# Patient Record
Sex: Female | Born: 1960
Health system: Southern US, Community
[De-identification: ages and names within clinical notes are randomized; demographics above are authoritative.]

## PROBLEM LIST (undated history)

## (undated) DIAGNOSIS — Z973 Presence of spectacles and contact lenses: Secondary | ICD-10-CM

## (undated) DIAGNOSIS — N9489 Other specified conditions associated with female genital organs and menstrual cycle: Secondary | ICD-10-CM

## (undated) DIAGNOSIS — Z8719 Personal history of other diseases of the digestive system: Secondary | ICD-10-CM

## (undated) DIAGNOSIS — E785 Hyperlipidemia, unspecified: Secondary | ICD-10-CM

## (undated) HISTORY — PX: OTHER SURGICAL HISTORY: SHX169

---
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
## 2013-02-28 ENCOUNTER — Other Ambulatory Visit: Payer: Self-pay | Admitting: Physician Assistant

## 2013-02-28 DIAGNOSIS — N6310 Unspecified lump in the right breast, unspecified quadrant: Secondary | ICD-10-CM

## 2013-12-08 ENCOUNTER — Emergency Department (HOSPITAL_COMMUNITY): Payer: BC Managed Care – PPO

## 2013-12-08 ENCOUNTER — Emergency Department (HOSPITAL_COMMUNITY)
Admission: EM | Admit: 2013-12-08 | Discharge: 2013-12-08 | Disposition: A | Discharge status: Home / Self Care | Payer: BC Managed Care – PPO | Attending: Emergency Medicine | Admitting: Emergency Medicine

## 2013-12-08 ENCOUNTER — Encounter (HOSPITAL_COMMUNITY): Payer: Self-pay | Admitting: Emergency Medicine

## 2013-12-08 DIAGNOSIS — S4980XA Other specified injuries of shoulder and upper arm, unspecified arm, initial encounter: Secondary | ICD-10-CM | POA: Insufficient documentation

## 2013-12-08 DIAGNOSIS — E785 Hyperlipidemia, unspecified: Secondary | ICD-10-CM | POA: Insufficient documentation

## 2013-12-08 DIAGNOSIS — Y92410 Unspecified street and highway as the place of occurrence of the external cause: Secondary | ICD-10-CM | POA: Diagnosis not present

## 2013-12-08 DIAGNOSIS — S1093XA Contusion of unspecified part of neck, initial encounter: Secondary | ICD-10-CM | POA: Diagnosis not present

## 2013-12-08 DIAGNOSIS — S0993XA Unspecified injury of face, initial encounter: Secondary | ICD-10-CM | POA: Diagnosis not present

## 2013-12-08 DIAGNOSIS — Y9389 Activity, other specified: Secondary | ICD-10-CM | POA: Diagnosis not present

## 2013-12-08 DIAGNOSIS — S0003XA Contusion of scalp, initial encounter: Secondary | ICD-10-CM | POA: Insufficient documentation

## 2013-12-08 DIAGNOSIS — S0083XA Contusion of other part of head, initial encounter: Secondary | ICD-10-CM | POA: Insufficient documentation

## 2013-12-08 DIAGNOSIS — S199XXA Unspecified injury of neck, initial encounter: Secondary | ICD-10-CM

## 2013-12-08 DIAGNOSIS — Z79899 Other long term (current) drug therapy: Secondary | ICD-10-CM | POA: Diagnosis not present

## 2013-12-08 DIAGNOSIS — S46909A Unspecified injury of unspecified muscle, fascia and tendon at shoulder and upper arm level, unspecified arm, initial encounter: Secondary | ICD-10-CM | POA: Diagnosis present

## 2013-12-08 HISTORY — DX: Hyperlipidemia, unspecified: E78.5

## 2013-12-08 LAB — I-STAT CHEM 8, ED
BUN: 6 mg/dL (ref 6–23)
CREATININE: 0.6 mg/dL (ref 0.50–1.10)
Calcium, Ion: 1.11 mmol/L — ABNORMAL LOW (ref 1.12–1.23)
Chloride: 107 mEq/L (ref 96–112)
Glucose, Bld: 114 mg/dL — ABNORMAL HIGH (ref 70–99)
HCT: 40 % (ref 36.0–46.0)
HEMOGLOBIN: 13.6 g/dL (ref 12.0–15.0)
Potassium: 3.8 mEq/L (ref 3.7–5.3)
Sodium: 139 mEq/L (ref 137–147)
TCO2: 24 mmol/L (ref 0–100)

## 2013-12-08 MED ORDER — IOHEXOL 300 MG/ML  SOLN
80.0000 mL | Freq: Once | INTRAMUSCULAR | Status: AC | PRN
Start: 2013-12-08 — End: 2013-12-08
  Administered 2013-12-08: 80 mL via INTRAVENOUS

## 2013-12-08 MED ORDER — HYDROGEN PEROXIDE 3 % EX SOLN
CUTANEOUS | Status: AC
  Administered 2013-12-08: 13:00:00
  Filled 2013-12-08: qty 473

## 2013-12-08 MED ORDER — MORPHINE SULFATE 4 MG/ML IJ SOLN
4.0000 mg | Freq: Once | INTRAMUSCULAR | Status: AC
  Administered 2013-12-08: 4 mg via INTRAVENOUS
  Filled 2013-12-08: qty 1
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
## 2013-12-08 NOTE — ED Notes (Signed)
NP at bedside.

## 2013-12-08 NOTE — ED Notes (Signed)
Pt decided to take remaining morphine.  Morphine given.
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
## 2013-12-08 NOTE — ED Notes (Signed)
Pt had a purse with her at discharge.
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
## 2013-12-08 NOTE — ED Notes (Signed)
Pt only wanted 2 mg of Morphine
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
## 2013-12-08 NOTE — ED Notes (Signed)
On assessment, pt denies any numbness/tingling to any extremity.  No chest pain.  No abdominal pain.  Does have pain to left side of head.  No shortness of breath.  Vitals stable.
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
## 2013-12-12 NOTE — ED Provider Notes (Deleted)
Medical screening examination/treatment/procedure(s) were performed by non-physician practitioner and as supervising physician I was immediately available for consultation/collaboration.  Truly Stankiewicz L Britt Petroni, MD 12/12/13 1107 
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
## 2016-09-25 ENCOUNTER — Encounter (HOSPITAL_BASED_OUTPATIENT_CLINIC_OR_DEPARTMENT_OTHER): Payer: Self-pay | Admitting: *Deleted

## 2016-12-08 ENCOUNTER — Encounter (HOSPITAL_BASED_OUTPATIENT_CLINIC_OR_DEPARTMENT_OTHER): Payer: Self-pay | Admitting: *Deleted

## 2016-12-08 NOTE — Progress Notes (Signed)
Pt instructed npo pmn 9/20.  To [facility] 9/21 @ 0600.  Pt to get cbc, cmet, T&S, on Wed 9/19.  Pt aware that she will need to wear bracelet, w/o removal, for t&s until p surgery , otherwise T&S will have to be redrawn.  . Pt verbalized her understanding.

## 2016-12-09 NOTE — H&P (Signed)
Stephanie Pittman is an 56 y.o. female with pelvic pain found to have right adnexal findings including 2 simple cysts measuring 2.3 and 3.2 cm as well as a tubular structure measuring 4.4 cm and likely hydrosalpinx.  CA-125 4.  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  She also is s/p endometrial ablation and left inguinal hernia repair.  The patient presents for surgical treatment of right adnexal mass and removal of remainder of left ovary.  Pertinent Gynecological History: Menses: post-menopausal Bleeding: n/a Contraception: post menopausal status DES exposure: unknown Blood transfusions: none Sexually transmitted diseases: no past history Previous GYN Procedures: endometrial ablation, laparotomy for right oophorectomy and partial left oophorectomy, left inguinal hernia repair  Last mammogram: normal Date: 07/2016 Last pap: normal Date: 07/2016 OB History: G4, P4  Menstrual History: Menarche age: n/a No LMP recorded. Patient is postmenopausal.    Past Medical History:  Diagnosis Date  . Adnexal mass    right  . History of diverticulitis   . Hyperlipidemia   . Wears glasses     Past Surgical History:  Procedure Laterality Date  . HERNIA REPAIR Left 1976  . ooophorectomy Right   . TONSILLECTOMY  1968    History reviewed. No pertinent family history.  Social History:  reports that she has never smoked. She has never used smokeless tobacco. She reports that she drinks alcohol. She reports that she does not use drugs.  Allergies: No Known Allergies  No prescriptions prior to admission.    ROS  Height  (1.626 m), weight 137 lb (62.1 kg). Physical Exam  Constitutional: She is oriented to person, place, and time. She appears well-developed and well-nourished.  GI: Soft. There is no rebound and no guarding.  Neurological: She is alert and oriented to person, place, and time.   Skin: Skin is warm and dry.  Psychiatric: She has a normal mood and affect. Her behavior is normal.    No results found for this or any previous visit (from the past 24 hour(s)).  No results found.  Assessment/Plan: 55yo with right adnexal mass -L/S removal of right adnexal mass and LSO -Patient has been counseled re: risk of bleeding, infection, scarring, and damage to surrounding structures.  She understands the possible need to convert to abdominal procedure to complete the case if needed.  All questions were answered and the patient wishes to proceed.  Khaleed Holan 12/09/2016, 1:39 PM

## 2016-12-10 DIAGNOSIS — N83201 Unspecified ovarian cyst, right side: Secondary | ICD-10-CM | POA: Diagnosis not present

## 2016-12-10 DIAGNOSIS — E785 Hyperlipidemia, unspecified: Secondary | ICD-10-CM | POA: Diagnosis not present

## 2016-12-10 DIAGNOSIS — N7011 Chronic salpingitis: Secondary | ICD-10-CM | POA: Diagnosis not present

## 2016-12-10 DIAGNOSIS — Z8719 Personal history of other diseases of the digestive system: Secondary | ICD-10-CM | POA: Diagnosis not present

## 2016-12-10 DIAGNOSIS — N949 Unspecified condition associated with female genital organs and menstrual cycle: Secondary | ICD-10-CM | POA: Diagnosis present

## 2016-12-10 LAB — CBC
HEMATOCRIT: 40.2 % (ref 36.0–46.0)
Hemoglobin: 14.1 g/dL (ref 12.0–15.0)
MCH: 31.4 pg (ref 26.0–34.0)
MCHC: 35.1 g/dL (ref 30.0–36.0)
MCV: 89.5 fL (ref 78.0–100.0)
Platelets: 290 10*3/uL (ref 150–400)
RBC: 4.49 MIL/uL (ref 3.87–5.11)
RDW: 12.8 % (ref 11.5–15.5)
WBC: 7 10*3/uL (ref 4.0–10.5)

## 2016-12-10 LAB — TYPE AND SCREEN
ABO/RH(D): O POS
ANTIBODY SCREEN: NEGATIVE

## 2016-12-10 LAB — COMPREHENSIVE METABOLIC PANEL
ALT: 14 U/L (ref 14–54)
AST: 18 U/L (ref 15–41)
Albumin: 4.5 g/dL (ref 3.5–5.0)
Alkaline Phosphatase: 90 U/L (ref 38–126)
Anion gap: 8 (ref 5–15)
BILIRUBIN TOTAL: 0.5 mg/dL (ref 0.3–1.2)
BUN: 11 mg/dL (ref 6–20)
CO2: 27 mmol/L (ref 22–32)
Calcium: 9.2 mg/dL (ref 8.9–10.3)
Chloride: 103 mmol/L (ref 101–111)
Creatinine, Ser: 0.75 mg/dL (ref 0.44–1.00)
GFR calc Af Amer: >60 mL/min (ref >60)
GFR calc non Af Amer: >60 mL/min (ref >60)
GLUCOSE: 104 mg/dL — AB (ref 65–99)
Potassium: 4.4 mmol/L (ref 3.5–5.1)
Sodium: 138 mmol/L (ref 135–145)
TOTAL PROTEIN: 7.6 g/dL (ref 6.5–8.1)

## 2016-12-10 LAB — ABO/RH: ABO/RH(D): O POS

## 2016-12-12 ENCOUNTER — Ambulatory Visit (HOSPITAL_BASED_OUTPATIENT_CLINIC_OR_DEPARTMENT_OTHER): Payer: BLUE CROSS/BLUE SHIELD | Admitting: Anesthesiology

## 2016-12-12 ENCOUNTER — Ambulatory Visit (HOSPITAL_BASED_OUTPATIENT_CLINIC_OR_DEPARTMENT_OTHER)
Admission: RE | Admit: 2016-12-12 | Discharge: 2016-12-12 | Disposition: A | Discharge status: Home / Self Care | Payer: BLUE CROSS/BLUE SHIELD | Source: Ambulatory Visit | Attending: Obstetrics & Gynecology | Admitting: Obstetrics & Gynecology

## 2016-12-12 ENCOUNTER — Encounter (HOSPITAL_BASED_OUTPATIENT_CLINIC_OR_DEPARTMENT_OTHER)
Admission: RE | Disposition: A | Discharge status: Home / Self Care | Payer: Self-pay | Source: Ambulatory Visit | Attending: Obstetrics & Gynecology

## 2016-12-12 ENCOUNTER — Encounter (HOSPITAL_BASED_OUTPATIENT_CLINIC_OR_DEPARTMENT_OTHER): Payer: Self-pay

## 2016-12-12 DIAGNOSIS — E785 Hyperlipidemia, unspecified: Secondary | ICD-10-CM | POA: Insufficient documentation

## 2016-12-12 DIAGNOSIS — Z8719 Personal history of other diseases of the digestive system: Secondary | ICD-10-CM | POA: Insufficient documentation

## 2016-12-12 DIAGNOSIS — N9489 Other specified conditions associated with female genital organs and menstrual cycle: Secondary | ICD-10-CM

## 2016-12-12 DIAGNOSIS — N7011 Chronic salpingitis: Secondary | ICD-10-CM | POA: Diagnosis not present

## 2016-12-12 DIAGNOSIS — N83201 Unspecified ovarian cyst, right side: Secondary | ICD-10-CM | POA: Insufficient documentation

## 2016-12-12 HISTORY — DX: Other specified conditions associated with female genital organs and menstrual cycle: N94.89

## 2016-12-12 HISTORY — PX: LAPAROSCOPY: SHX197

## 2016-12-12 HISTORY — DX: Personal history of other diseases of the digestive system: Z87.19

## 2016-12-12 HISTORY — DX: Presence of spectacles and contact lenses: Z97.3

## 2016-12-12 LAB — POCT PREGNANCY, URINE: Preg Test, Ur: NEGATIVE

## 2016-12-12 SURGERY — LAPAROSCOPY OPERATIVE
Anesthesia: General | Site: Abdomen | Laterality: Bilateral

## 2016-12-12 MED ORDER — ACETAMINOPHEN 500 MG PO TABS
ORAL_TABLET | ORAL | Status: AC
Start: 2016-12-12 — End: 2016-12-12
  Filled 2016-12-12: qty 2

## 2016-12-12 MED ORDER — LIDOCAINE 2% (20 MG/ML) 5 ML SYRINGE
INTRAMUSCULAR | Status: AC
  Filled 2016-12-12: qty 5

## 2016-12-12 MED ORDER — ARTIFICIAL TEARS OPHTHALMIC OINT
TOPICAL_OINTMENT | OPHTHALMIC | Status: AC
  Filled 2016-12-12: qty 3.5

## 2016-12-12 MED ORDER — ONDANSETRON HCL 4 MG/2ML IJ SOLN
INTRAMUSCULAR | Status: DC | PRN
  Administered 2016-12-12: 4 mg via INTRAVENOUS

## 2016-12-12 MED ORDER — FENTANYL CITRATE (PF) 100 MCG/2ML IJ SOLN
25.0000 ug | INTRAMUSCULAR | Status: DC | PRN
  Filled 2016-12-12: qty 1

## 2016-12-12 MED ORDER — LACTATED RINGERS IV SOLN
INTRAVENOUS | Status: DC
  Filled 2016-12-12: qty 1000

## 2016-12-12 MED ORDER — OXYCODONE HCL 5 MG PO TABS
ORAL_TABLET | ORAL | Status: AC
  Filled 2016-12-12: qty 1

## 2016-12-12 MED ORDER — CELECOXIB 200 MG PO CAPS
200.0000 mg | ORAL_CAPSULE | Freq: Once | ORAL | Status: AC
  Administered 2016-12-12: 200 mg via ORAL
  Filled 2016-12-12: qty 1

## 2016-12-12 MED ORDER — GABAPENTIN 300 MG PO CAPS
ORAL_CAPSULE | ORAL | Status: AC
  Filled 2016-12-12: qty 1

## 2016-12-12 MED ORDER — FENTANYL CITRATE (PF) 250 MCG/5ML IJ SOLN
INTRAMUSCULAR | Status: AC
  Filled 2016-12-12: qty 5

## 2016-12-12 MED ORDER — IBUPROFEN 600 MG PO TABS: 600.0000 mg | ORAL_TABLET | Freq: Four times a day (QID) | ORAL | 0 refills | Status: AC | PRN

## 2016-12-12 MED ORDER — ROCURONIUM BROMIDE 50 MG/5ML IV SOSY
PREFILLED_SYRINGE | INTRAVENOUS | Status: AC
  Filled 2016-12-12: qty 5

## 2016-12-12 MED ORDER — MIDAZOLAM HCL 2 MG/2ML IJ SOLN
INTRAMUSCULAR | Status: AC
  Filled 2016-12-12: qty 2

## 2016-12-12 MED ORDER — SUGAMMADEX SODIUM 200 MG/2ML IV SOLN
INTRAVENOUS | Status: AC
  Filled 2016-12-12: qty 2

## 2016-12-12 MED ORDER — EPHEDRINE SULFATE-NACL 50-0.9 MG/10ML-% IV SOSY
PREFILLED_SYRINGE | INTRAVENOUS | Status: DC | PRN
Start: 2016-12-12 — End: 2016-12-12
  Administered 2016-12-12: 10 mg via INTRAVENOUS

## 2016-12-12 MED ORDER — BUPIVACAINE HCL (PF) 0.5 % IJ SOLN
INTRAMUSCULAR | Status: DC | PRN
  Administered 2016-12-12: 6 mL

## 2016-12-12 MED ORDER — ONDANSETRON HCL 4 MG/2ML IJ SOLN
INTRAMUSCULAR | Status: AC
  Filled 2016-12-12: qty 2

## 2016-12-12 MED ORDER — DEXAMETHASONE SODIUM PHOSPHATE 10 MG/ML IJ SOLN
INTRAMUSCULAR | Status: AC
  Filled 2016-12-12: qty 1

## 2016-12-12 MED ORDER — CELECOXIB 200 MG PO CAPS
ORAL_CAPSULE | ORAL | Status: AC
  Filled 2016-12-12: qty 1

## 2016-12-12 MED ORDER — DEXAMETHASONE SODIUM PHOSPHATE 10 MG/ML IJ SOLN
INTRAMUSCULAR | Status: DC | PRN
  Administered 2016-12-12: 10 mg via INTRAVENOUS

## 2016-12-12 MED ORDER — OXYCODONE HCL 5 MG PO TABS
5.0000 mg | ORAL_TABLET | Freq: Once | ORAL | Status: AC
  Administered 2016-12-12: 5 mg via ORAL
  Filled 2016-12-12: qty 1

## 2016-12-12 MED ORDER — PROPOFOL 10 MG/ML IV BOLUS
INTRAVENOUS | Status: DC | PRN
  Administered 2016-12-12: 150 mg via INTRAVENOUS

## 2016-12-12 MED ORDER — MIDAZOLAM HCL 2 MG/2ML IJ SOLN
INTRAMUSCULAR | Status: DC | PRN
  Administered 2016-12-12: 2 mg via INTRAVENOUS

## 2016-12-12 MED ORDER — LIDOCAINE 2% (20 MG/ML) 5 ML SYRINGE
INTRAMUSCULAR | Status: DC | PRN
  Administered 2016-12-12: 60 mg via INTRAVENOUS

## 2016-12-12 MED ORDER — SCOPOLAMINE 1 MG/3DAYS TD PT72
1.0000 | MEDICATED_PATCH | Freq: Once | TRANSDERMAL | Status: DC
  Administered 2016-12-12: 1.5 mg via TRANSDERMAL
  Filled 2016-12-12: qty 1

## 2016-12-12 MED ORDER — FENTANYL CITRATE (PF) 100 MCG/2ML IJ SOLN
INTRAMUSCULAR | Status: DC | PRN
Start: 2016-12-12 — End: 2016-12-12
  Administered 2016-12-12 (×3): 50 ug via INTRAVENOUS

## 2016-12-12 MED ORDER — SUGAMMADEX SODIUM 200 MG/2ML IV SOLN: INTRAVENOUS | Status: DC | PRN

## 2016-12-12 MED ORDER — SCOPOLAMINE 1 MG/3DAYS TD PT72
MEDICATED_PATCH | TRANSDERMAL | Status: AC
  Filled 2016-12-12: qty 1

## 2016-12-12 MED ORDER — PROMETHAZINE HCL 25 MG/ML IJ SOLN
6.2500 mg | INTRAMUSCULAR | Status: DC | PRN
  Filled 2016-12-12: qty 1

## 2016-12-12 MED ORDER — LACTATED RINGERS IV SOLN
INTRAVENOUS | Status: DC
  Administered 2016-12-12: 1000 mL via INTRAVENOUS
  Administered 2016-12-12: 08:00:00 via INTRAVENOUS
  Filled 2016-12-12: qty 1000

## 2016-12-12 MED ORDER — EPHEDRINE 5 MG/ML INJ
INTRAVENOUS | Status: AC
  Filled 2016-12-12: qty 10

## 2016-12-12 MED ORDER — PROPOFOL 10 MG/ML IV BOLUS
INTRAVENOUS | Status: AC
  Filled 2016-12-12: qty 40

## 2016-12-12 MED ORDER — OXYCODONE-ACETAMINOPHEN 5-325 MG PO TABS: 1.0000 | ORAL_TABLET | ORAL | 0 refills | Status: AC | PRN

## 2016-12-12 MED ORDER — ACETAMINOPHEN 500 MG PO TABS
1000.0000 mg | ORAL_TABLET | Freq: Once | ORAL | Status: AC
  Administered 2016-12-12: 1000 mg via ORAL
  Filled 2016-12-12: qty 2

## 2016-12-12 MED ORDER — SUGAMMADEX SODIUM 200 MG/2ML IV SOLN
INTRAVENOUS | Status: DC | PRN
  Administered 2016-12-12: 130 mg via INTRAVENOUS

## 2016-12-12 MED ORDER — ROCURONIUM BROMIDE 10 MG/ML (PF) SYRINGE
PREFILLED_SYRINGE | INTRAVENOUS | Status: DC | PRN
  Administered 2016-12-12: 40 mg via INTRAVENOUS
  Administered 2016-12-12: 5 mg via INTRAVENOUS

## 2016-12-12 MED ORDER — GABAPENTIN 300 MG PO CAPS
300.0000 mg | ORAL_CAPSULE | Freq: Once | ORAL | Status: AC
  Administered 2016-12-12: 300 mg via ORAL
  Filled 2016-12-12: qty 1

## 2016-12-12 MED FILL — OXYCOD/ACETAMINOPHEN 5-325M: 5-325 | 2 days supply | Qty: 20 | Fill #0

## 2016-12-12 SURGICAL SUPPLY — 42 items
BAG RETRIEVAL 10 (BASKET) ×1
BAG RETRIEVAL 10MM (BASKET) ×1
BARRIER ADHS 3X4 INTERCEED (GAUZE/BANDAGES/DRESSINGS) IMPLANT
CANISTER SUCT 3000ML PPV (MISCELLANEOUS) IMPLANT
CATH ROBINSON RED A/P 14FR (CATHETERS) ×3 IMPLANT
COVER MAYO STAND STRL (DRAPES) ×3 IMPLANT
DERMABOND ADVANCED (GAUZE/BANDAGES/DRESSINGS) ×2
DERMABOND ADVANCED .7 DNX12 (GAUZE/BANDAGES/DRESSINGS) ×1 IMPLANT
DRSG OPSITE POSTOP 3X4 (GAUZE/BANDAGES/DRESSINGS) ×3 IMPLANT
DURAPREP 26ML APPLICATOR (WOUND CARE) ×3 IMPLANT
ELECT REM PT RETURN 9FT ADLT (ELECTROSURGICAL) ×3
ELECTRODE REM PT RTRN 9FT ADLT (ELECTROSURGICAL) ×1 IMPLANT
GLOVE BIO SURGEON STRL SZ 6 (GLOVE) ×3 IMPLANT
GLOVE BIOGEL PI IND STRL 6 (GLOVE) ×2 IMPLANT
GLOVE BIOGEL PI INDICATOR 6 (GLOVE) ×4
GOWN STRL REUS W/ TWL LRG LVL3 (GOWN DISPOSABLE) ×3 IMPLANT
GOWN STRL REUS W/TWL LRG LVL3 (GOWN DISPOSABLE) ×6
KIT RM TURNOVER CYSTO AR (KITS) ×3 IMPLANT
LIGASURE LAP L-HOOKWIRE 5 44CM (INSTRUMENTS) ×3 IMPLANT
NEEDLE INSUFFLATION 120MM (ENDOMECHANICALS) ×3 IMPLANT
NS IRRIG 500ML POUR BTL (IV SOLUTION) ×3 IMPLANT
PACK LAPAROSCOPY BASIN (CUSTOM PROCEDURE TRAY) ×3 IMPLANT
PACK TRENDGUARD 450 HYBRID PRO (MISCELLANEOUS) ×1 IMPLANT
PAD OB MATERNITY 4.3X12.25 (PERSONAL CARE ITEMS) ×3 IMPLANT
PENCIL BUTTON HOLSTER BLD 10FT (ELECTRODE) IMPLANT
POUCH SPECIMEN RETRIEVAL 10MM (ENDOMECHANICALS) IMPLANT
SCISSORS LAP 5X35 DISP (ENDOMECHANICALS) IMPLANT
SEALER TISSUE G2 CVD JAW 45CM (ENDOMECHANICALS) IMPLANT
SET IRRIG TUBING LAPAROSCOPIC (IRRIGATION / IRRIGATOR) IMPLANT
SUT MNCRL AB 3-0 PS2 18 (SUTURE) ×3 IMPLANT
SUT VICRYL 0 UR6 27IN ABS (SUTURE) ×3 IMPLANT
SYRINGE 10CC LL (SYRINGE) ×3 IMPLANT
SYS BAG RETRIEVAL 10MM (BASKET) ×1
SYSTEM BAG RETRIEVAL 10MM (BASKET) ×1 IMPLANT
TOWEL OR 17X24 6PK STRL BLUE (TOWEL DISPOSABLE) ×6 IMPLANT
TRENDGUARD 450 HYBRID PRO PACK (MISCELLANEOUS) ×3
TROCAR XCEL NON-BLD 11X100MML (ENDOMECHANICALS) ×3 IMPLANT
TROCAR XCEL NON-BLD 5MMX100MML (ENDOMECHANICALS) ×3 IMPLANT
TUBE CONNECTING 12'X1/4 (SUCTIONS)
TUBE CONNECTING 12X1/4 (SUCTIONS) IMPLANT
TUBING INSUF HEATED (TUBING) ×3 IMPLANT
WARMER LAPAROSCOPE (MISCELLANEOUS) ×3 IMPLANT

## 2016-12-12 NOTE — Anesthesia Procedure Notes (Signed)
Procedure Name: Intubation Date/Time: 12/12/2016 7:32 AM Performed by: Catalina Gravel Pre-anesthesia Checklist: Patient identified, Emergency Drugs available, Suction available and Patient being monitored Patient Re-evaluated:Patient Re-evaluated prior to induction Oxygen Delivery Method: Circle system utilized Preoxygenation: Pre-oxygenation with 100% oxygen Induction Type: IV induction Ventilation: Mask ventilation without difficulty Laryngoscope Size: Mac and 3 Grade View: Grade I Tube type: Oral Tube size: 7.0 mm Number of attempts: 1 Airway Equipment and Method: Stylet and LTA kit utilized Placement Confirmation: ETT inserted through vocal cords under direct vision,  positive ETCO2 and breath sounds checked- equal and bilateral Secured at: 21 cm Tube secured with: Tape Dental Injury: Teeth and Oropharynx as per pre-operative assessment

## 2016-12-12 NOTE — Transfer of Care (Signed)
  Last Vitals:  Vitals:   12/12/16 0609 12/12/16 0858  BP: 119/77 (P) 92/69  Pulse: 66   Resp: 16   Temp: 36.6 C 36.4 C  SpO2: 100%     Last Pain:  Vitals:   12/12/16 0626  TempSrc:   PainSc: 2       Patients Stated Pain Goal: 5 (12/12/16 9528)  Immediate Anesthesia Transfer of Care Note  Patient: Stephanie Pittman  Procedure(s) Performed: Procedure(s) (LRB): LAPAROSCOPY OPERATIVE, RIGHT SALPINGECTOMY, LEFT SALPINGOOPHERECTOMY (Bilateral)  Patient Location: PACU  Anesthesia Type: General  Level of Consciousness: awake, alert  and oriented  Airway & Oxygen Therapy: Patient Spontanous Breathing and Patient connected to nasal cannula oxygen  Post-op Assessment: Report given to PACU RN and Post -op Vital signs reviewed and stable  Post vital signs: Reviewed and stable  Complications: No apparent anesthesia complications

## 2016-12-12 NOTE — Anesthesia Preprocedure Evaluation (Signed)
Anesthesia Evaluation  Patient identified by MRN, date of birth, ID band Patient awake    Reviewed: Allergy & Precautions, NPO status , Patient's Chart, lab work & pertinent test results  Airway Mallampati: II  TM Distance: >3 FB Neck ROM: Full    Dental  (+) Teeth Intact, Dental Advisory Given   Pulmonary neg pulmonary ROS,    Pulmonary exam normal breath sounds clear to auscultation       Cardiovascular Normal cardiovascular exam Rhythm:Regular Rate:Normal  HLD   Neuro/Psych negative neurological ROS     GI/Hepatic negative GI ROS, Neg liver ROS,   Endo/Other  negative endocrine ROS  Renal/GU negative Renal ROS     Musculoskeletal negative musculoskeletal ROS (+)   Abdominal   Peds  Hematology negative hematology ROS (+)   Anesthesia Other Findings Day of surgery medications reviewed with the patient.  Reproductive/Obstetrics Right adnexal mass                             Anesthesia Physical Anesthesia Plan  ASA: II  Anesthesia Plan: General   Post-op Pain Management:    Induction: Intravenous  PONV Risk Score and Plan: 3 and Ondansetron, Dexamethasone and Midazolam  Airway Management Planned: Oral ETT  Additional Equipment:   Intra-op Plan:   Post-operative Plan: Extubation in OR  Informed Consent: I have reviewed the patients History and Physical, chart, labs and discussed the procedure including the risks, benefits and alternatives for the proposed anesthesia with the patient or authorized representative who has indicated his/her understanding and acceptance.   Dental advisory given  Plan Discussed with: CRNA  Anesthesia Plan Comments: (Risks/benefits of general anesthesia discussed with patient including risk of damage to teeth, lips, gum, and tongue, nausea/vomiting, allergic reactions to medications, and the possibility of heart attack, stroke and death.  All  patient questions answered.  Patient wishes to proceed.)        Anesthesia Quick Evaluation

## 2016-12-12 NOTE — Anesthesia Postprocedure Evaluation (Signed)
Anesthesia Post Note  Patient: Stephanie Pittman  Procedure(s) Performed: Procedure(s) (LRB): LAPAROSCOPY OPERATIVE, RIGHT SALPINGECTOMY, LEFT SALPINGOOPHERECTOMY (Bilateral)     Patient location during evaluation: PACU Anesthesia Type: General Level of consciousness: awake and alert Pain management: pain level controlled Vital Signs Assessment: post-procedure vital signs reviewed and stable Respiratory status: spontaneous breathing, nonlabored ventilation and respiratory function stable Cardiovascular status: blood pressure returned to baseline and stable Postop Assessment: no apparent nausea or vomiting Anesthetic complications: no    Last Vitals:  Vitals:   12/12/16 0930 12/12/16 0945  BP: (!) 113/55 102/63  Pulse: 73 66  Resp: 12 11  Temp:    SpO2: 100% 98%    Last Pain:  Vitals:   12/12/16 0945  TempSrc:   PainSc: 5                  Cecile Hearing

## 2016-12-12 NOTE — Op Note (Signed)
Stephanie Pittman 12/12/2016  PREOPERATIVE DIAGNOSIS:  Right adnexal mass  POSTOPERATIVE DIAGNOSIS:  SAA with right hydrosalpinx  PROCEDURE:  Laparoscopic right salpingectomy, left salpingo-oophorectomy  ANESTHESIA:  General endotracheal  COMPLICATIONS:  None immediate.  PATHOLOGY:  1. Right fallopian tube and 2. left fallopian tube and ovary  ESTIMATED BLOOD LOSS:  Less than 20 ml.  INDICATIONS: 56 y.o.  With right adnexal mass and pain; normal CA-125 presents for definitive management.    FINDINGS:  Normal uterus and left fallopian tube and ovary.  Dilated right fallopian tube and surgically absent right ovary.  TECHNIQUE:  The patient was taken to the operating room where general anesthesia was obtained without difficulty.  She was then placed in the dorsal lithotomy position and prepared and draped in sterile fashion.  After an adequate timeout was performed, a bivalved speculum was then placed in the patient's vagina, and the anterior lip of cervix grasped with the single-tooth tenaculum.  The hulka clip was advanced into the uterus.  The speculum was removed from the vagina.  Attention was then turned to the patient's abdomen where a 10-mm skin incision was made on the umbilical fold.  The Veress needle was carefully introduced into the peritoneal cavity through the abdominal wall.  Intraperitoneal placement was confirmed by drop in intraabdominal pressure with insufflation of carbon dioxide gas.  Adequate pneumoperitoneum was obtained, and the 10/11 XL trocar was then advanced without difficulty into the abdomen where intraabdominal placement was confirmed by the operative laparoscope.  A 5-mm skin incision was made in the suprapubic region.  The 5 mm trocar was advanced under direct visualization with the laparoscope.  Survey of the pelvis revealed the above listed findings.  A small filmy adhesion of the omentum to the right hydrosalpinx was taken down using the Ligasure.  The right  dilated tube was elevated and removed using the Ligasure.  Pedicles were inspected and found to be hemostatic.  The left fallopian tube and ovary were then elevated and also removed using the Ligasure.  There was oozing at the site of tubal removal close to the uterus which was rendered hemostatic using the Klepinger.  The left fallopian tube and ovary and right hydrosalpinx were removed using an Endocatch bag and sent to pathology.  Hemostasis was observed.  All instruments were removed from the pelvis.  The infraumbilical fascia was closed using 0 Vicryl in a figure of eight stitch.  Both skin incisions were closed using 3-0 monocryl in a subcuticular fashion.  Dressings were applied.  The uterine manipulator and the tenaculum were removed from the vagina without complications. The patient tolerated the procedure well.  Sponge, lap, and needle counts were correct times two.  The patient was then taken to the recovery room awake, extubated and in stable  in stable condition.

## 2016-12-12 NOTE — Progress Notes (Signed)
No change to H&P.  Chelci Wintermute, DO 

## 2016-12-12 NOTE — Discharge Instructions (Signed)
Call MD for T>100.4, heavy vaginal bleeding, severe abdominal pain, intractable nausea and/or vomiting, or respiratory distress.  Call office to schedule postop appointment in 2 weeks.  No driving while taking narcotics.  Pelvic rest x 2 weeks.       Post Anesthesia Home Care Instructions  Activity: Get plenty of rest for the remainder of the day. A responsible individual must stay with you for 24 hours following the procedure.  For the next 24 hours, DO NOT: -Drive a car -Advertising copywriter -Drink alcoholic beverages -Take any medication unless instructed by your physician -Make any legal decisions or sign important papers.  Meals: Start with liquid foods such as gelatin or soup. Progress to regular foods as tolerated. Avoid greasy, spicy, heavy foods. If nausea and/or vomiting occur, drink only clear liquids until the nausea and/or vomiting subsides. Call your physician if vomiting continues.  Special Instructions/Symptoms: Your throat may feel dry or sore from the anesthesia or the breathing tube placed in your throat during surgery. If this causes discomfort, gargle with warm salt water. The discomfort should disappear within 24 hours.  If you had a scopolamine patch placed behind your ear for the management of post- operative nausea and/or vomiting:  1. The medication in the patch is effective for 72 hours, after which it should be removed.  Wrap patch in a tissue and discard in the trash. Wash hands thoroughly with soap and water. 2. You may remove the patch earlier than 72 hours if you experience unpleasant side effects which may include dry mouth, dizziness or visual disturbances. 3. Avoid touching the patch. Wash your hands with soap and water after contact with the patch.   HOME CARE INSTRUCTIONS - LAPAROSCOPY  Wound Care: The bandaids or dressing which are placed over the skin openings may be removed the day after surgery. The incision should be kept clean and dry. The  stitches do not need to be removed. Should the incision become sore, red, and swollen after the first week, check with your doctor.  Personal Hygiene: Shower the day after your procedure. Always wipe from front to back after elimination.   Activity: Do not drive or operate any equipment today. The effects of the anesthesia are still present and drowsiness may result. Rest today, not necessarily flat bed rest, just take it easy. You may resume your normal activity in one to three days or as instructed by your physician.  Sexual Activity: You resume sexual activity as indicated by your physician_________. If your laparoscopy was for a sterilization ( tubes tied ), continue current method of birth control until after your next period or ask for specific instructions from your doctor.  Diet: Eat a light diet as desired this evening. You may resume a regular diet tomorrow.  Return to Work: Two to three days or as indicated by your doctor.  Expectations After Surgery: Your surgery will cause vaginal drainage or spotting which may continue for 2-3 days. Mild abdominal discomfort or tenderness is not unusual and some shoulder pain may also be noted which can be relieved by lying flat in pain.  Call Your Doctor If these Occur:  Persistent or heavy bleeding at incision site       Redness or swelling around incision       Elevation of temperature greater than 100 degrees F

## 2016-12-15 ENCOUNTER — Encounter (HOSPITAL_BASED_OUTPATIENT_CLINIC_OR_DEPARTMENT_OTHER): Payer: Self-pay | Admitting: Obstetrics & Gynecology

## 2018-01-04 ENCOUNTER — Other Ambulatory Visit: Payer: Self-pay | Admitting: Obstetrics & Gynecology

## 2018-01-04 DIAGNOSIS — R928 Other abnormal and inconclusive findings on diagnostic imaging of breast: Secondary | ICD-10-CM

## 2018-01-07 ENCOUNTER — Ambulatory Visit
Admission: RE | Admit: 2018-01-07 | Discharge: 2018-01-07 | Disposition: A | Discharge status: Home / Self Care | Payer: BLUE CROSS/BLUE SHIELD | Source: Ambulatory Visit | Attending: Obstetrics & Gynecology | Admitting: Obstetrics & Gynecology

## 2018-01-07 DIAGNOSIS — R928 Other abnormal and inconclusive findings on diagnostic imaging of breast: Secondary | ICD-10-CM

## 2018-01-13 ENCOUNTER — Ambulatory Visit
Admission: RE | Admit: 2018-01-13 | Discharge: 2018-01-13 | Disposition: A | Discharge status: Home / Self Care | Payer: BLUE CROSS/BLUE SHIELD | Source: Ambulatory Visit | Attending: Obstetrics & Gynecology | Admitting: Obstetrics & Gynecology

## 2018-01-13 ENCOUNTER — Other Ambulatory Visit: Payer: Self-pay | Admitting: Obstetrics & Gynecology

## 2018-01-13 DIAGNOSIS — N6320 Unspecified lump in the left breast, unspecified quadrant: Secondary | ICD-10-CM

## 2018-01-28 ENCOUNTER — Ambulatory Visit
Admission: RE | Admit: 2018-01-28 | Discharge: 2018-01-28 | Disposition: A | Discharge status: Home / Self Care | Payer: BLUE CROSS/BLUE SHIELD | Source: Ambulatory Visit | Attending: Obstetrics & Gynecology | Admitting: Obstetrics & Gynecology

## 2018-01-28 ENCOUNTER — Other Ambulatory Visit: Payer: Self-pay | Admitting: Obstetrics & Gynecology

## 2018-01-28 DIAGNOSIS — N6320 Unspecified lump in the left breast, unspecified quadrant: Secondary | ICD-10-CM

## 2019-01-26 IMAGING — US ULTRASOUND LEFT BREAST LIMITED
1 series · 11 of 11 positions shown · non-contrast
Comparison: Previous exams including recent screening mammogram
dated 10/31/2017

ADDENDUM:
Patient returned today for further evaluation of the hypoechoic mass
in the LEFT breast at the 12 o'clock axis, 5 cm from the nipple,
initially felt to be a benign cyst.

After further review, this 9 mm hypoechoic mass in the LEFT breast
at the 12 o'clock axis, 5 cm from the nipple, is slightly irregular
with anti parallel orientation. As such, ultrasound-guided biopsy is
now recommended.
CLINICAL DATA: Patient returns today to evaluate a possible LEFT
breast mass identified on recent screening mammogram.
EXAM:
DIGITAL DIAGNOSTIC LEFT MAMMOGRAM WITH CAD AND TOMO
ULTRASOUND LEFT BREAST

[Series 1: ultrasound left breast limited · 0.07mm/px · 11 of 11 slices shown]
[im 1/11]
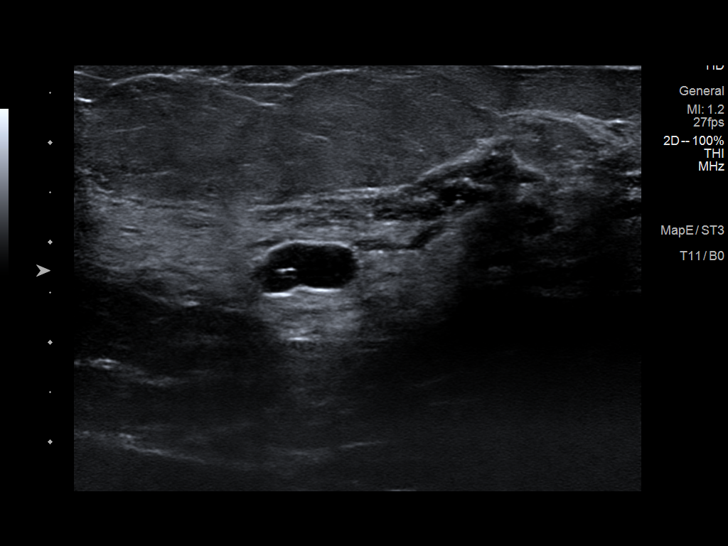
[im 2/11]
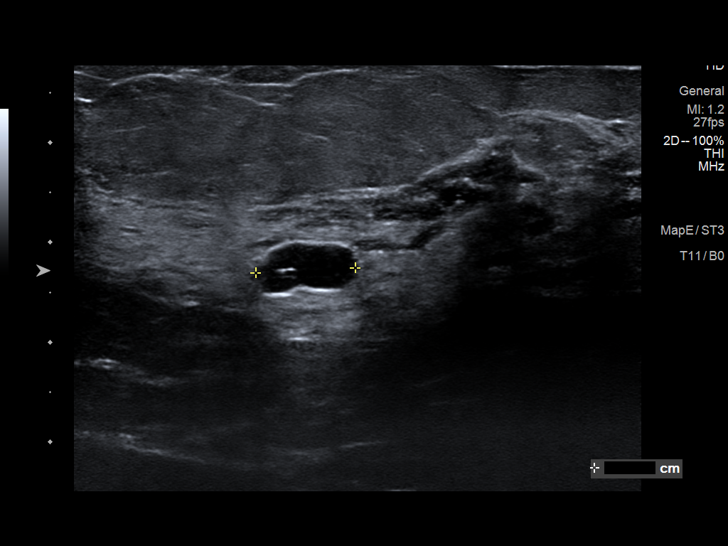
[im 3/11]
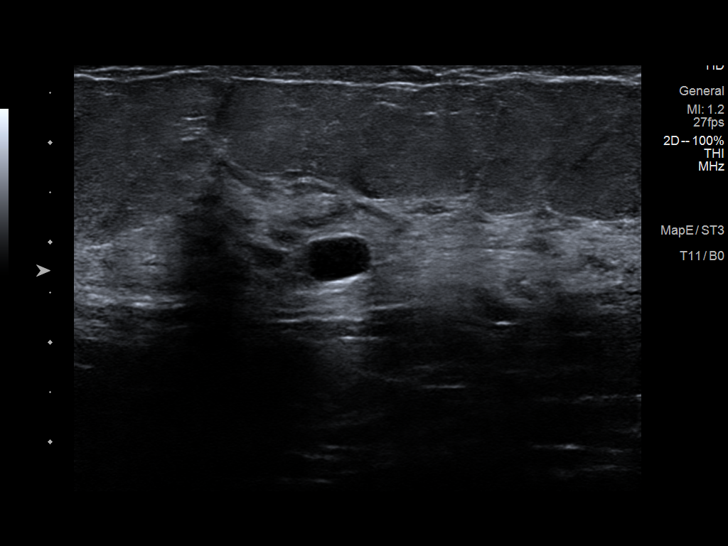
[im 4/11]
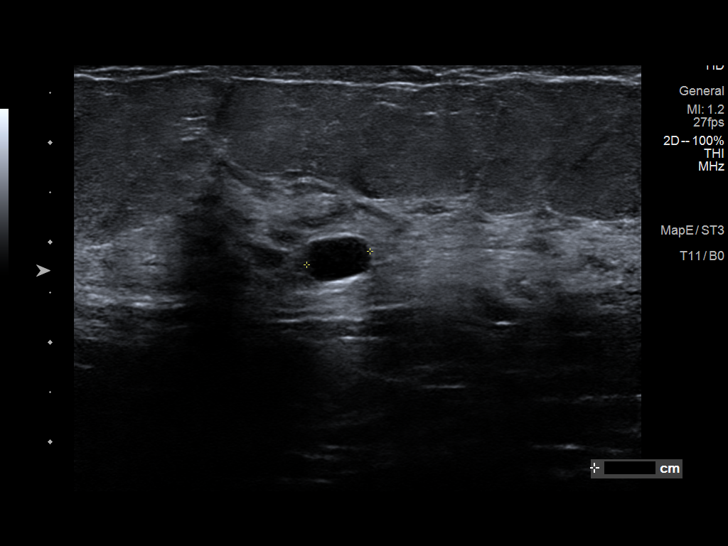
[im 5/11]
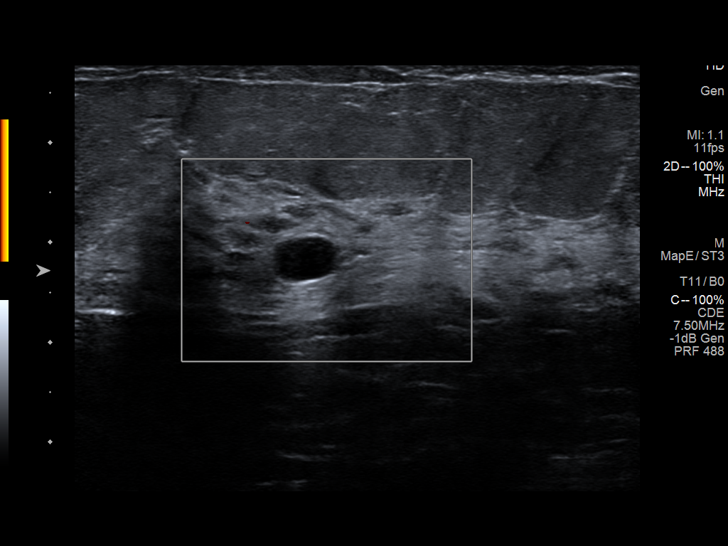
[im 6/11]
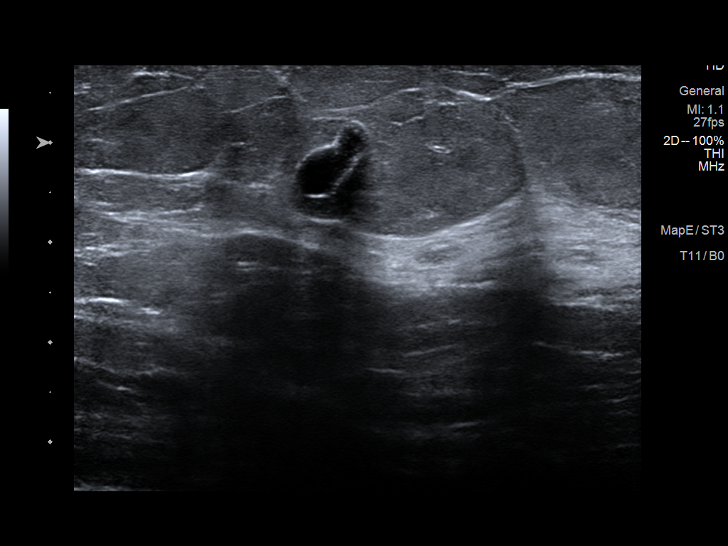
[im 7/11]
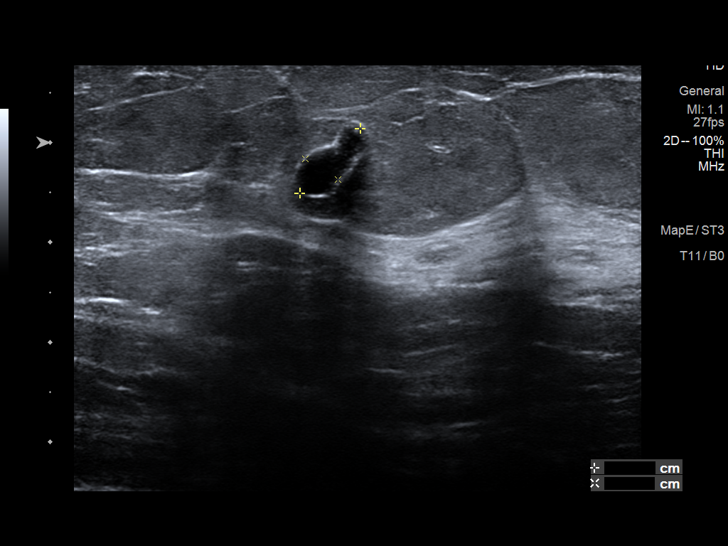
[im 8/11]
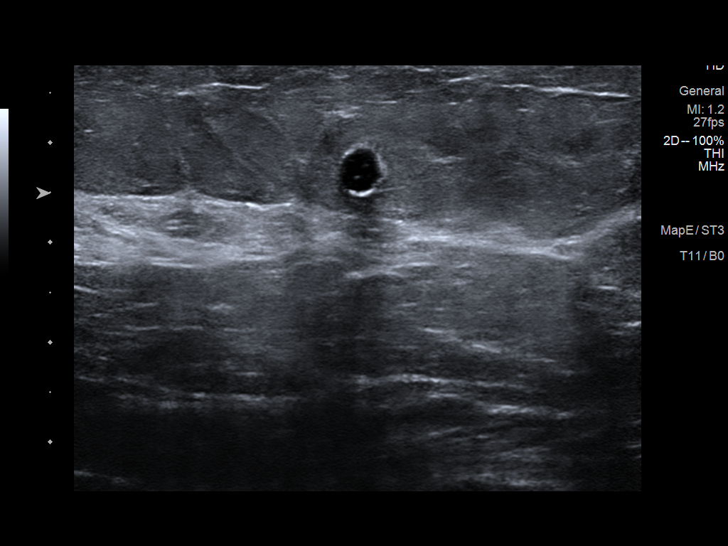
[im 9/11]
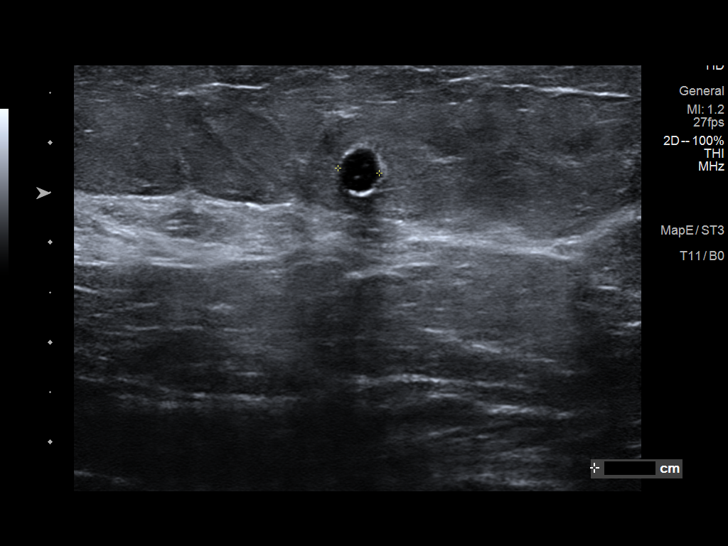
[im 10/11]
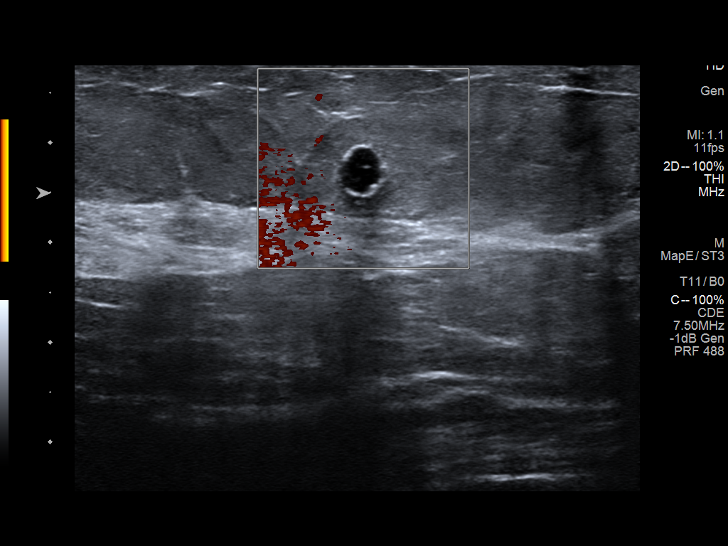
[im 11/11]
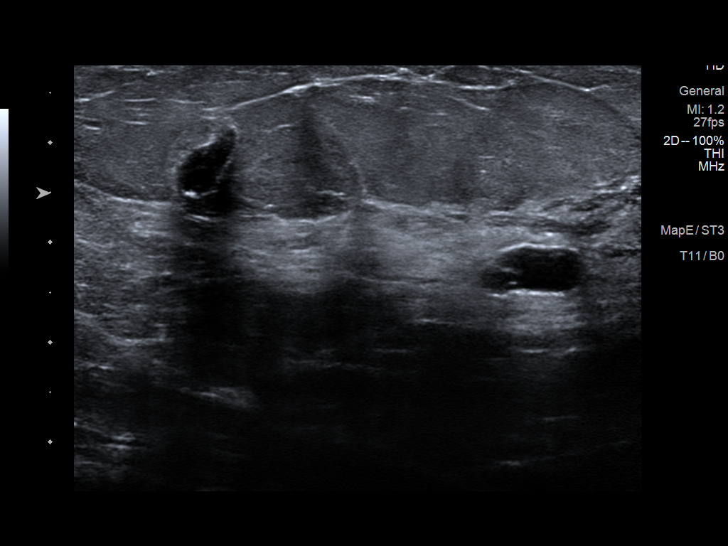

[11 of 11 positions shown; findings below may reference images not displayed]

IMPRESSION: Slightly irregular hypoechoic mass in the LEFT breast at
the 12 o'clock axis, 5 cm from the nipple, with suspicious anti
parallel orientation. Ultrasound-guided biopsy is now recommended to
ensure benignity, possibly a complicated cyst or cluster of cysts,
possibly corresponding to focus of benign fat necrosis seen in the
upper LEFT breast on earlier screening mammogram of 12/31/2017.
Recommend correlation with post mammogram.

RECOMMENDATION: Ultrasound-guided biopsy of the LEFT breast mass at
the 12 o'clock axis, 5 cm from the nipple.

Ultrasound-guided biopsy is scheduled for 01/28/2018.
ACR Breast Density Category c: The breast tissue is heterogeneously
dense, which may obscure small masses.
FINDINGS: Multiple partially obscured masses are confirmed within the
retroareolar LEFT breast, anterior to middle depth, largest
measuring approximately 7 mm greatest dimension.

Mammographic images were processed with CAD.

Targeted ultrasound is performed, showing multiple benign cysts in
the LEFT breast at the 12 o'clock axis, 3-5 cm from nipple, largest
measuring 10 mm, corresponding to the mammographic findings. No
suspicious solid or cystic mass is identified by ultrasound.
IMPRESSION: No evidence of malignancy. Benign cysts within the upper LEFT
breast, corresponding to the mammographic findings.

Patient may return to routine annual bilateral screening mammogram
schedule.

RECOMMENDATION:
Screening mammogram in one year.(Code:[id number])

I have discussed the findings and recommendations with the patient.
Results were also provided in writing at the conclusion of the
visit. If applicable, a reminder letter will be sent to the patient
regarding the next appointment.

BI-RADS CATEGORY  2: Benign.

## 2019-02-16 IMAGING — MG MM CLIP PLACEMENT
2 series · 2 of 2 positions shown · non-contrast
Comparison: Previous exam(s).

CLINICAL DATA: Status post ultrasound-guided biopsy of a LEFT
breast mass at the 12:30 o'clock axis.

EXAM:
DIAGNOSTIC LEFT MAMMOGRAM POST ULTRASOUND BIOPSY

[L ML]
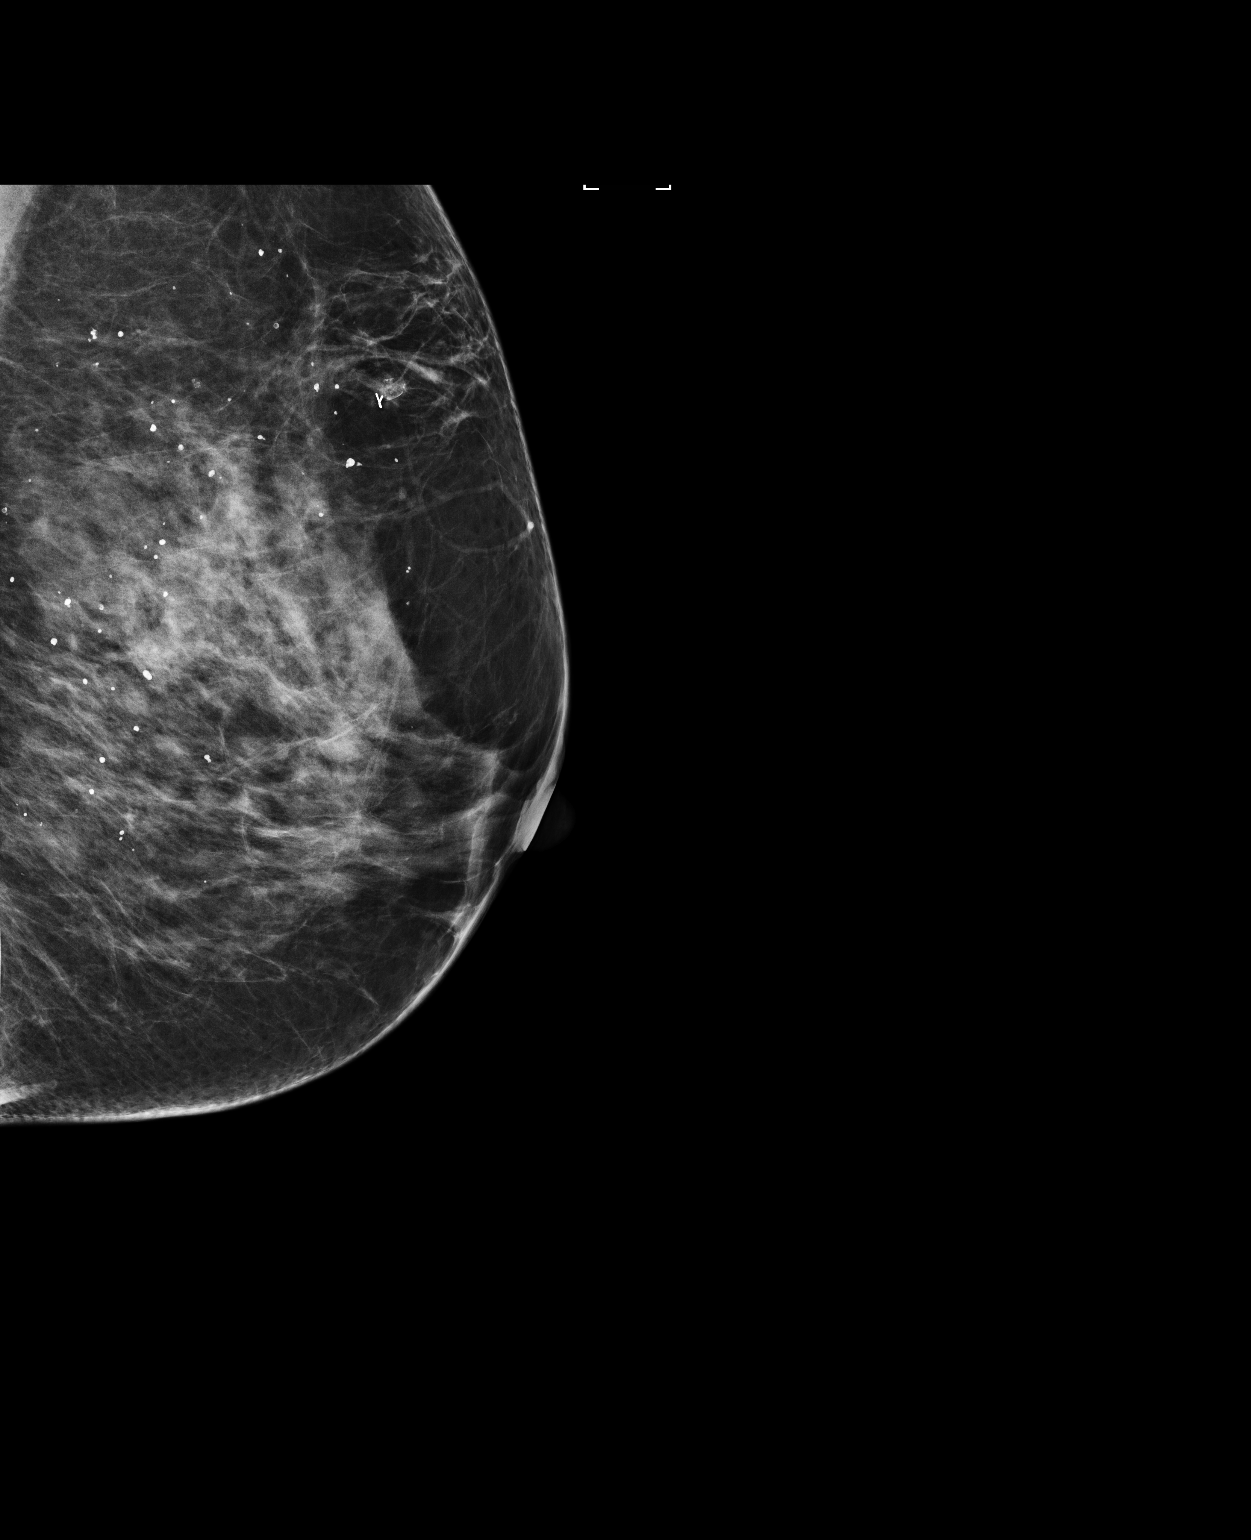

[L CC]
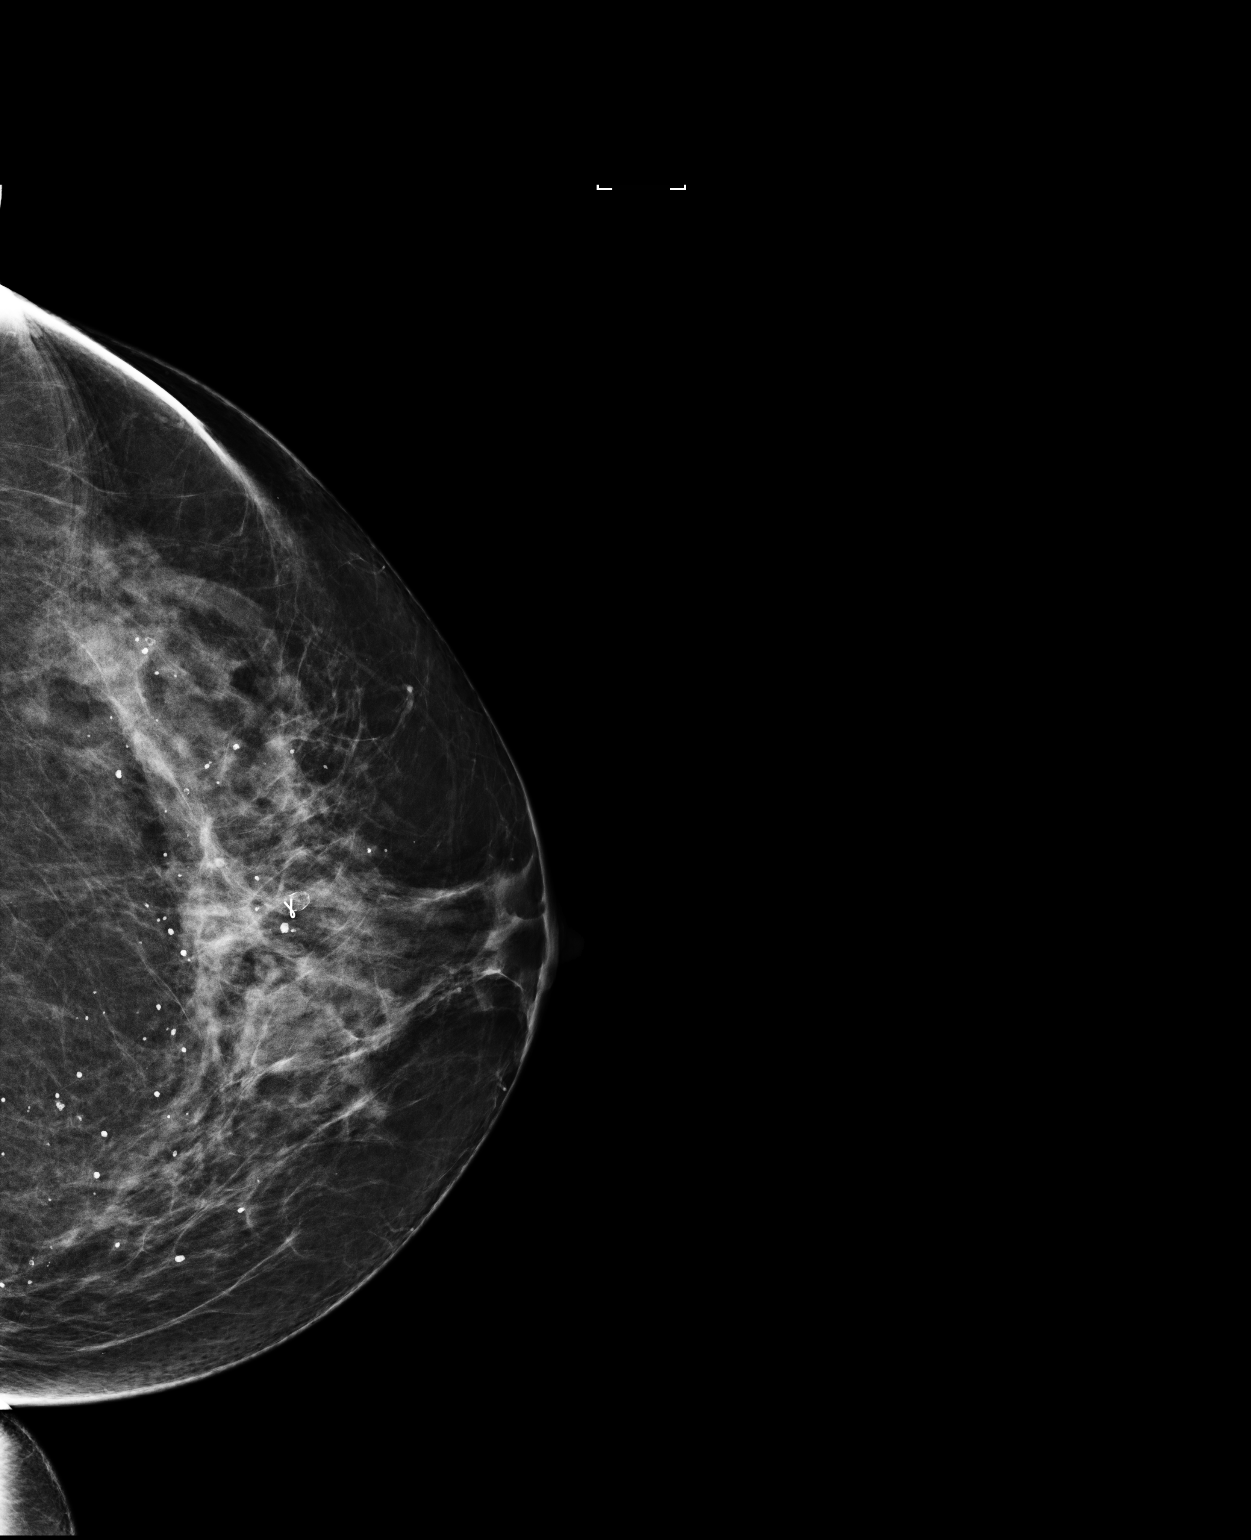

[2 of 2 positions shown; findings below may reference images not displayed]

FINDINGS: Mammographic images were obtained following ultrasound guided biopsy
of the LEFT breast mass at the 12:30 o'clock axis. Ribbon shaped
clip is appropriately positioned.
IMPRESSION: Ribbon shaped clip is appropriately positioned within the upper LEFT
breast corresponding to the targeted mass at the 12:30 o'clock axis.

Final Assessment: Post Procedure Mammograms for Marker Placement

## 2019-07-02 ENCOUNTER — Ambulatory Visit: Payer: Self-pay | Attending: Internal Medicine

## 2019-07-02 DIAGNOSIS — Z23 Encounter for immunization: Secondary | ICD-10-CM

## 2019-07-02 NOTE — Progress Notes (Signed)
   Covid-19 Vaccination Clinic  Name:  Stephanie Pittman    MRN: [id number] DOB: November 22, 1960  07/02/2019  [name] was observed post Covid-19 immunization for 15 minutes without incident. She was provided with Vaccine Information Sheet and instruction to access the V-Safe system.   [name] was instructed to call 911 with any severe reactions post vaccine: Marland Kitchen Difficulty breathing  . Swelling of face and throat  . A fast heartbeat  . A bad rash all over body  . Dizziness and weakness   Immunizations Administered    Name Date Dose VIS Date Route   Pfizer COVID-19 Vaccine 07/02/2019  9:28 AM 0.3 mL 03/04/2019 Intramuscular   Manufacturer: [employer], [employer]   Lot: [id number]   NDC: [address]-9

## 2019-07-27 ENCOUNTER — Ambulatory Visit: Payer: Self-pay

## 2019-07-30 ENCOUNTER — Ambulatory Visit: Payer: Self-pay | Attending: Internal Medicine

## 2019-07-30 DIAGNOSIS — Z23 Encounter for immunization: Secondary | ICD-10-CM

## 2019-07-30 NOTE — Progress Notes (Signed)
   Covid-19 Vaccination Clinic  Name:  Stephanie Pittman    MRN: [id number] DOB: 02-Jun-1960  07/30/2019  Stephanie Pittman was observed post Covid-19 immunization for 15 minutes without incident. She was provided with Vaccine Information Sheet and instruction to access the V-Safe system.   Stephanie Pittman was instructed to call 911 with any severe reactions post vaccine: Marland Kitchen Difficulty breathing  . Swelling of face and throat  . A fast heartbeat  . A bad rash all over body  . Dizziness and weakness   Immunizations Administered    Name Date Dose VIS Date Route   Pfizer COVID-19 Vaccine 07/30/2019 10:06 AM 0.3 mL 05/18/2018 Intramuscular   Manufacturer: [employer], [employer]   Lot: [id number]   NDC: [address]-6

## 2022-06-24 ENCOUNTER — Encounter: Payer: Self-pay | Admitting: Gastroenterology

## 2022-08-14 ENCOUNTER — Ambulatory Visit: Payer: Self-pay | Admitting: Gastroenterology
